# Patient Record
Sex: Male | Born: 1990 | Race: Black or African American | Hispanic: No | Marital: Single | Smoking: Current every day smoker
Health system: Southern US, Community
[De-identification: ages and names within clinical notes are randomized; demographics above are authoritative.]

---
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
## 2009-01-03 ENCOUNTER — Emergency Department (HOSPITAL_COMMUNITY): Admission: EM | Admit: 2009-01-03 | Discharge: 2009-01-04 | Payer: Self-pay | Admitting: Emergency Medicine

## 2009-11-29 ENCOUNTER — Emergency Department (HOSPITAL_COMMUNITY): Admission: EM | Admit: 2009-11-29 | Discharge: 2009-11-29 | Payer: Self-pay | Admitting: Family Medicine

## 2009-12-24 ENCOUNTER — Emergency Department: Payer: Self-pay | Admitting: Unknown Physician Specialty

## 2010-06-26 ENCOUNTER — Emergency Department (HOSPITAL_COMMUNITY): Admission: EM | Admit: 2010-06-26 | Discharge: 2010-06-26 | Payer: Self-pay | Admitting: Emergency Medicine

## 2010-10-07 ENCOUNTER — Emergency Department (HOSPITAL_COMMUNITY): Payer: Self-pay

## 2010-10-07 ENCOUNTER — Emergency Department (HOSPITAL_COMMUNITY)
Admission: EM | Admit: 2010-10-07 | Discharge: 2010-10-07 | Disposition: A | Discharge status: Home / Self Care | Payer: Self-pay | Attending: Emergency Medicine | Admitting: Emergency Medicine

## 2010-10-07 DIAGNOSIS — R05 Cough: Secondary | ICD-10-CM | POA: Insufficient documentation

## 2010-10-07 DIAGNOSIS — IMO0001 Reserved for inherently not codable concepts without codable children: Secondary | ICD-10-CM | POA: Insufficient documentation

## 2010-10-07 DIAGNOSIS — R070 Pain in throat: Secondary | ICD-10-CM | POA: Insufficient documentation

## 2010-10-07 DIAGNOSIS — R059 Cough, unspecified: Secondary | ICD-10-CM | POA: Insufficient documentation

## 2010-10-07 DIAGNOSIS — R071 Chest pain on breathing: Secondary | ICD-10-CM | POA: Insufficient documentation

## 2010-10-07 DIAGNOSIS — J069 Acute upper respiratory infection, unspecified: Secondary | ICD-10-CM | POA: Insufficient documentation

## 2010-10-07 DIAGNOSIS — J3489 Other specified disorders of nose and nasal sinuses: Secondary | ICD-10-CM | POA: Insufficient documentation

## 2010-11-16 LAB — GC/CHLAMYDIA PROBE AMP, GENITAL: Chlamydia, DNA Probe: POSITIVE — AB

## 2010-11-16 LAB — URINALYSIS, ROUTINE W REFLEX MICROSCOPIC
Glucose, UA: NEGATIVE mg/dL
Ketones, ur: 15 mg/dL — AB
Nitrite: NEGATIVE
Protein, ur: 100 mg/dL — AB
Specific Gravity, Urine: 1.028 (ref 1.005–1.030)
Urobilinogen, UA: 1 mg/dL (ref 0.0–1.0)
pH: 6.5 (ref 5.0–8.0)

## 2010-11-16 LAB — URINE MICROSCOPIC-ADD ON

## 2010-11-27 LAB — GC/CHLAMYDIA PROBE AMP, GENITAL
Chlamydia, DNA Probe: NEGATIVE
GC Probe Amp, Genital: POSITIVE — AB

## 2011-07-23 ENCOUNTER — Emergency Department (HOSPITAL_COMMUNITY): Payer: Self-pay

## 2011-07-23 ENCOUNTER — Encounter: Payer: Self-pay | Admitting: Emergency Medicine

## 2011-07-23 ENCOUNTER — Emergency Department (HOSPITAL_COMMUNITY)
Admission: EM | Admit: 2011-07-23 | Discharge: 2011-07-23 | Disposition: A | Discharge status: Home / Self Care | Payer: Self-pay | Attending: Emergency Medicine | Admitting: Emergency Medicine

## 2011-07-23 DIAGNOSIS — R059 Cough, unspecified: Secondary | ICD-10-CM | POA: Insufficient documentation

## 2011-07-23 DIAGNOSIS — R05 Cough: Secondary | ICD-10-CM | POA: Insufficient documentation

## 2011-07-23 DIAGNOSIS — R55 Syncope and collapse: Secondary | ICD-10-CM

## 2011-07-23 DIAGNOSIS — R079 Chest pain, unspecified: Secondary | ICD-10-CM | POA: Insufficient documentation

## 2011-07-23 LAB — POCT I-STAT, CHEM 8
BUN: 6 mg/dL (ref 6–23)
Calcium, Ion: 1.2 mmol/L (ref 1.12–1.32)
Creatinine, Ser: 0.9 mg/dL (ref 0.50–1.35)
TCO2: 27 mmol/L (ref 0–100)

## 2011-07-23 MED ORDER — OXYCODONE-ACETAMINOPHEN 5-325 MG PO TABS
1.0000 | ORAL_TABLET | Freq: Once | ORAL | Status: AC
  Administered 2011-07-23: 1 via ORAL
  Filled 2011-07-23: qty 1

## 2011-07-23 MED ORDER — OXYCODONE-ACETAMINOPHEN 5-325 MG PO TABS: 1.0000 | ORAL_TABLET | Freq: Four times a day (QID) | ORAL | Status: AC | PRN

## 2011-07-23 NOTE — ED Notes (Signed)
Pt tolerated PO challenge

## 2011-07-23 NOTE — ED Notes (Signed)
Gave new ECG to Dr. Bebe Shaggy after I performed. 2:44 pm JG

## 2011-07-23 NOTE — ED Provider Notes (Addendum)
History     CSN: [id number] Arrival date & time: 07/23/2011  2:03 PM   First MD Initiated Contact with Patient 07/23/11 1403   Pt seen with medical student, I performed history and physical    Chief Complaint  Patient presents with  . Chest Pain     Patient is a 20 y.o. male presenting with chest pain. The history is provided by the patient.  Chest Pain Episode onset: several days ago. Chest pain occurs constantly. The chest pain is unchanged. The pain is associated with lifting and breathing (palpation). The severity of the pain is moderate. The quality of the pain is described as aching. The pain does not radiate. Chest pain is worsened by certain positions. Primary symptoms include syncope. Pertinent negatives for primary symptoms include no abdominal pain and no altered mental status.  Pertinent negatives for associated symptoms include no diaphoresis. He tried nothing for the symptoms.   Pt is here for multiple complaints: chest pain for several days, reports recent cough and his chest wall has been tender.  He also reports syncope episode yesterday and today - today's episode occurred after he went running after a phone call.  His mother reports she found him lying down yesterday, unclear cause.   Pt reports he is active, runs frequently, before this past week never had cp/sob/syncope with exertion No recent travel No h/o DVT No h/o familal sudden death Mother reports she has heart disease Pt denies h/o cocaine use No head injury reported No significant headache reported  PMH - none  History reviewed. No pertinent past surgical history.  History reviewed. No pertinent family history.  History  Substance Use Topics  . Smoking status: Current Everyday Smoker -- 2.0 packs/day  . Smokeless tobacco: Not on file  . Alcohol Use: No      Review of Systems  Constitutional: Negative for diaphoresis.  Cardiovascular: Positive for chest pain and syncope.  Gastrointestinal:  Negative for abdominal pain.  Psychiatric/Behavioral: Negative for altered mental status.  All other systems reviewed and are negative.    Allergies  Review of patient's allergies indicates no known allergies.  Home Medications  No current outpatient prescriptions on file.  BP 117/70  Pulse 65  Temp(Src) 98.6 F (37 C) (Oral)  Resp 18  SpO2 98%  Physical Exam CONSTITUTIONAL: Well developed/well nourished HEAD AND FACE: Normocephalic/atraumatic EYES: EOMI/PERRL ENMT: Mucous membranes moist NECK: supple no meningeal signs SPINE:entire spine nontender CV: S1/S2 noted, no murmurs/rubs/gallops noted Chest - tender to palpation, no crepitance LUNGS: Lungs are clear to auscultation bilaterally, no apparent distress ABDOMEN: soft, nontender, no rebound or guarding GU:no cva tenderness NEURO: Pt is awake/alert, moves all extremitiesx4, ambulatory EXTREMITIES: pulses normal, full ROM, no edema noted SKIN: warm, color normal PSYCH: no abnormalities of mood noted  ED Course  Procedures    Labs Reviewed  I-STAT TROPONIN I  I-STAT, CHEM 8    3:21 PM Pt stable at this time [facility] negative Doubt PE EKG unchanged from prior Will get CXR.  Will get troponin to r/o any obvious myocarditis as he reports recent cough Doubt ACS Doubt HOCM   4:23 PM Pt feeling improved Plan is to await labs and reassess   MDM  Nursing notes reviewed and considered in documentation Previous records reviewed and considered xrays reviewed and considered     Date: 07/23/2011  Rate: 59  Rhythm: sinus bradycardia  QRS Axis: normal  Intervals: normal  ST/T Wave abnormalities: early repolarization  Conduction Disutrbances:none  Narrative  Interpretation:   Old EKG Reviewed: unchanged from prior EKG from 10/07/2010         Joya Gaskins, MD 07/23/11 1623  I discharged patient Dr hunt did not see patient He is improved, ambulatory He is to f/u with cardiology I asked him to  avoid activity for next week until seen by cardiology Family/patient agreeable  Joya Gaskins, MD 07/23/11 [phone]

## 2011-07-23 NOTE — ED Notes (Signed)
Pt BIBEMS with c/o chest pain, pt states left side non radiating chest pain for three days s/p playing basketball and working out

## 2011-07-23 NOTE — ED Notes (Signed)
Gave ECG to Dr.

## 2012-03-19 ENCOUNTER — Encounter (HOSPITAL_COMMUNITY): Payer: Self-pay | Admitting: Adult Health

## 2012-03-19 ENCOUNTER — Emergency Department (HOSPITAL_COMMUNITY)
Admission: EM | Admit: 2012-03-19 | Discharge: 2012-03-19 | Payer: Self-pay | Attending: Emergency Medicine | Admitting: Emergency Medicine

## 2012-03-19 ENCOUNTER — Emergency Department (HOSPITAL_COMMUNITY): Payer: Self-pay

## 2012-03-19 DIAGNOSIS — S00432A Contusion of left ear, initial encounter: Secondary | ICD-10-CM

## 2012-03-19 DIAGNOSIS — S01309A Unspecified open wound of unspecified ear, initial encounter: Secondary | ICD-10-CM | POA: Insufficient documentation

## 2012-03-19 DIAGNOSIS — R51 Headache: Secondary | ICD-10-CM | POA: Insufficient documentation

## 2012-03-19 DIAGNOSIS — S00439A Contusion of unspecified ear, initial encounter: Secondary | ICD-10-CM | POA: Insufficient documentation

## 2012-03-19 DIAGNOSIS — R220 Localized swelling, mass and lump, head: Secondary | ICD-10-CM | POA: Insufficient documentation

## 2012-03-19 DIAGNOSIS — S0990XA Unspecified injury of head, initial encounter: Secondary | ICD-10-CM | POA: Insufficient documentation

## 2012-03-19 DIAGNOSIS — S01319A Laceration without foreign body of unspecified ear, initial encounter: Secondary | ICD-10-CM

## 2012-03-19 MED ORDER — HYDROCODONE-ACETAMINOPHEN 5-325 MG PO TABS
1.0000 | ORAL_TABLET | Freq: Once | ORAL | Status: AC
  Administered 2012-03-19: 1 via ORAL
  Filled 2012-03-19: qty 1

## 2012-03-19 MED ORDER — IBUPROFEN 800 MG PO TABS: 800.0000 mg | ORAL_TABLET | Freq: Three times a day (TID) | ORAL | Status: AC

## 2012-03-19 MED ORDER — CEPHALEXIN 500 MG PO CAPS: 500.0000 mg | ORAL_CAPSULE | Freq: Four times a day (QID) | ORAL | Status: AC

## 2012-03-19 MED ORDER — TETANUS-DIPHTH-ACELL PERTUSSIS 5-2.5-18.5 LF-MCG/0.5 IM SUSP
0.5000 mL | Freq: Once | INTRAMUSCULAR | Status: AC
  Administered 2012-03-19: 0.5 mL via INTRAMUSCULAR
  Filled 2012-03-19: qty 0.5

## 2012-03-19 NOTE — ED Provider Notes (Signed)
Medical screening examination/treatment/procedure(s) were conducted as a shared visit with non-physician practitioner(s) and myself.  I personally evaluated the patient during the encounter   Shelda Jakes, MD 03/19/12 [phone]
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
## 2012-03-19 NOTE — ED Notes (Signed)
Discharged home with written and verbal instructions.  Released to [employer].

## 2012-03-19 NOTE — ED Provider Notes (Signed)
Patient seen by Dr Deretha Emory.  Please see his note for full H&P, care plan, etc.    LACERATION REPAIR Performed by: Rise Patience  Laverle Patter, PA-S, supervised by Sahory Nordling B) Consent: Verbal consent obtained. Risks and benefits: risks, benefits and alternatives were discussed Patient identity confirmed: provided demographic data Time out performed prior to procedure Prepped and Draped in normal sterile fashion Wound explored  Laceration Location: left pinna, posterior  Laceration Length: 4cm  No Foreign Bodies seen or palpated  Anesthesia: local infiltration  Local anesthetic: lidocaine 2% no epinephrine  Anesthetic total: 4 ml  Irrigation method: syringe Amount of cleaning: standard  Skin closure: 5-0 nylon  Number of sutures or staples: 5  Technique: simple interrupted  Patient tolerance: Patient tolerated the procedure well with no immediate complications.   Rise Patience, [place] 03/19/12 0630
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
## 2012-03-19 NOTE — ED Notes (Signed)
ENT

## 2012-03-19 NOTE — ED Notes (Signed)
ENT MD paged.
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
## 2013-02-23 ENCOUNTER — Encounter (HOSPITAL_COMMUNITY): Payer: Self-pay | Admitting: Emergency Medicine

## 2013-02-23 ENCOUNTER — Emergency Department (INDEPENDENT_AMBULATORY_CARE_PROVIDER_SITE_OTHER)
Admission: EM | Admit: 2013-02-23 | Discharge: 2013-02-23 | Disposition: A | Discharge status: Home / Self Care | Payer: Self-pay | Source: Home / Self Care | Attending: Family Medicine | Admitting: Family Medicine

## 2013-02-23 DIAGNOSIS — B029 Zoster without complications: Secondary | ICD-10-CM

## 2013-02-23 DIAGNOSIS — J309 Allergic rhinitis, unspecified: Secondary | ICD-10-CM

## 2013-02-23 DIAGNOSIS — B028 Zoster with other complications: Secondary | ICD-10-CM

## 2013-02-23 DIAGNOSIS — J302 Other seasonal allergic rhinitis: Secondary | ICD-10-CM

## 2013-02-23 MED ORDER — VALACYCLOVIR HCL 1 G PO TABS: 1000.0000 mg | ORAL_TABLET | Freq: Three times a day (TID) | ORAL | Status: DC

## 2013-02-23 MED ORDER — FLUTICASONE PROPIONATE 50 MCG/ACT NA SUSP: 1.0000 | Freq: Two times a day (BID) | NASAL | Status: DC

## 2013-02-23 MED ORDER — FEXOFENADINE HCL 180 MG PO TABS: 180.0000 mg | ORAL_TABLET | Freq: Every day | ORAL | Status: DC

## 2013-02-23 NOTE — ED Provider Notes (Signed)
History     CSN: [id number]  Arrival date & time 02/23/13  1513   First MD Initiated Contact with Patient 02/23/13 1553      Chief Complaint  Patient presents with  . Rash    (Consider location/radiation/quality/duration/timing/severity/associated sxs/prior treatment) Patient is a 22 y.o. male presenting with rash. The history is provided by the patient.  Rash Pain quality: burning   Pain radiates to:  Chest Pain severity:  Mild Onset quality:  Sudden Duration:  2 days Progression:  Worsening Chronicity:  New Associated symptoms: chest pain   Associated symptoms: no cough, no fever and no shortness of breath     History reviewed. No pertinent past medical history.  History reviewed. No pertinent past surgical history.  No family history on file.  History  Substance Use Topics  . Smoking status: Current Every Day Smoker -- 2.00 packs/day  . Smokeless tobacco: Not on file  . Alcohol Use: No      Review of Systems  Constitutional: Negative.  Negative for fever.  HENT: Positive for congestion, rhinorrhea and postnasal drip.   Respiratory: Negative for cough and shortness of breath.   Cardiovascular: Positive for chest pain.  Skin: Positive for rash.    Allergies  Review of patient's allergies indicates no known allergies.  Home Medications   Current Outpatient Rx  Name  Route  Sig  Dispense  Refill  . fexofenadine (ALLEGRA) 180 MG tablet   Oral   Take 1 tablet (180 mg total) by mouth daily.   30 tablet   1   . fluticasone (FLONASE) 50 MCG/ACT nasal spray   Nasal   Place 1 spray into the nose 2 (two) times daily.   1 g   2   . valACYclovir (VALTREX) 1000 MG tablet   Oral   Take 1 tablet (1,000 mg total) by mouth 3 (three) times daily.   21 tablet   0     BP 116/77  Pulse 76  Temp(Src) 97.8 F (36.6 C) (Oral)  SpO2 94%  Physical Exam  Nursing note and vitals reviewed. Constitutional: He is oriented to person, place, and time. He appears  well-developed and well-nourished.  HENT:  Head: Normocephalic.  Right Ear: External ear normal.  Left Ear: External ear normal.  Nose: Mucosal edema and rhinorrhea present.  Mouth/Throat: Oropharynx is clear and moist.  Cardiovascular: Regular rhythm.   Pulmonary/Chest: Effort normal and breath sounds normal.  Neurological: He is alert and oriented to person, place, and time.  Skin: Skin is warm and dry. Rash noted.  Erythematous grouped vesicular left axillary rash, with assoc neuralgia of scapula.    ED Course  Procedures (including critical care time)  Labs Reviewed - No data to display No results found.   1. Herpes zoster dermatitis   2. Seasonal allergic rhinitis       MDM          Linna Hoff, MD 02/23/13 1601

## 2013-02-23 NOTE — ED Notes (Signed)
Pt here with localized raised rash to left axilla with radiating burning pain to back x 1 week.no other sx noted.vss

## 2014-12-21 ENCOUNTER — Encounter (HOSPITAL_COMMUNITY): Payer: Self-pay | Admitting: Family Medicine

## 2014-12-21 ENCOUNTER — Other Ambulatory Visit (HOSPITAL_COMMUNITY)
Admission: RE | Admit: 2014-12-21 | Discharge: 2014-12-21 | Disposition: A | Discharge status: Home / Self Care | Payer: Self-pay | Source: Ambulatory Visit | Attending: Family Medicine | Admitting: Family Medicine

## 2014-12-21 ENCOUNTER — Emergency Department (INDEPENDENT_AMBULATORY_CARE_PROVIDER_SITE_OTHER)
Admission: EM | Admit: 2014-12-21 | Discharge: 2014-12-21 | Disposition: A | Discharge status: Home / Self Care | Payer: Self-pay | Source: Home / Self Care | Attending: Family Medicine | Admitting: Family Medicine

## 2014-12-21 DIAGNOSIS — Z113 Encounter for screening for infections with a predominantly sexual mode of transmission: Secondary | ICD-10-CM | POA: Insufficient documentation

## 2014-12-21 DIAGNOSIS — R369 Urethral discharge, unspecified: Secondary | ICD-10-CM

## 2014-12-21 MED ORDER — AZITHROMYCIN 250 MG PO TABS
1000.0000 mg | ORAL_TABLET | Freq: Once | ORAL | Status: AC
Start: 2014-12-21 — End: 2014-12-21
  Administered 2014-12-21: 1000 mg via ORAL

## 2014-12-21 MED ORDER — AZITHROMYCIN 250 MG PO TABS
ORAL_TABLET | ORAL | Status: AC
  Filled 2014-12-21: qty 4

## 2014-12-21 MED ORDER — CEFTRIAXONE SODIUM 250 MG IJ SOLR
INTRAMUSCULAR | Status: AC
  Filled 2014-12-21: qty 250

## 2014-12-21 MED ORDER — CEFTRIAXONE SODIUM 250 MG IJ SOLR
250.0000 mg | Freq: Once | INTRAMUSCULAR | Status: AC
Start: 2014-12-21 — End: 2014-12-21
  Administered 2014-12-21: 250 mg via INTRAMUSCULAR

## 2014-12-21 NOTE — ED Notes (Signed)
Sprite zero with ice given to pt per request and RN ok.
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
## 2014-12-21 NOTE — ED Notes (Signed)
Pt  Reports  Symptoms  Of  Penile  Discharge

## 2014-12-21 NOTE — ED Provider Notes (Signed)
CSN: [id number]     Arrival date & time 12/21/14  1004 History   First MD Initiated Contact with Patient 12/21/14 1117     Chief Complaint  Patient presents with  . SEXUALLY TRANSMITTED DISEASE   (Consider location/radiation/quality/duration/timing/severity/associated sxs/prior Treatment) HPI  Upper abd pain: started 2 days ago. Went to [employer] and used a Neurosurgeonfilthy bathroom. Went home and developed white penile discharge and abd pain. Comes and goes. Worse w/ urination.   Dysuria: sharp burning sensation w/ urination.   Sexually active and uses condoms every time. Last sex 1 week ago. After lengthy discussion patient finally admitted to the fact that he had unprotected sex 2 weeks ago. No known sexually transmitted disease and sexual partners.  History reviewed. No pertinent past medical history. History reviewed. No pertinent past surgical history. Family History  Problem Relation Age of Onset  . Diabetes Mother   . Hypertension Mother   . Asthma Mother    History  Substance Use Topics  . Smoking status: Former Smoker -- 2.00 packs/day  . Smokeless tobacco: Not on file  . Alcohol Use: Yes     Comment: 2 beers daily    Review of Systems Per HPI with all other pertinent systems negative.   Allergies  Review of patient's allergies indicates no known allergies.  Home Medications   Prior to Admission medications   Not on File   BP 146/94 mmHg  Pulse 80  Temp(Src) 98.7 F (37.1 C) (Oral)  Resp 16  SpO2 99% Physical Exam Physical Exam  Constitutional: oriented to person, place, and time. appears well-developed and well-nourished. No distress.  HENT:  Head: Normocephalic and atraumatic.  Eyes: EOMI. PERRL.  Neck: Normal range of motion.  Cardiovascular: RRR, no m/r/g, 2+ distal pulses,  Pulmonary/Chest: Effort normal and breath sounds normal. No respiratory distress.  Abdominal: Soft. Bowel sounds are normal. NonTTP, no distension.  Musculoskeletal: Normal range of  motion. Non ttp, no effusion.  Neurological: alert and oriented to person, place, and time.  Skin: Skin is warm. No rash noted. non diaphoretic.  Psychiatric: normal mood and affect. behavior is normal. Judgment and thought content normal.  GU: Copious penile discharge on exam. No penile lesions. Testicles normal. No groin adenopathy.  ED Course  Procedures (including critical care time) Labs Review Labs Reviewed  URINE CULTURE  HIV ANTIBODY (ROUTINE TESTING)  RPR  URINE CYTOLOGY ANCILLARY ONLY    Imaging Review No results found.   MDM   1. Penile discharge    Azithromycin 1000 mg by mouth and ceftriaxone 250 mg IM administered likely gonorrhea or chlamydia. Discussed safe sex practices with patient. UA without evidence of UTI. urine culture sent.  HIV and RPR pending.    Ozella Rocksavid J Lindsee Labarre, MD 12/21/14 [phone]

## 2014-12-22 LAB — HIV ANTIBODY (ROUTINE TESTING W REFLEX): HIV SCREEN 4TH GENERATION: NONREACTIVE

## 2014-12-22 LAB — URINE CYTOLOGY ANCILLARY ONLY
Chlamydia: NEGATIVE
Neisseria Gonorrhea: POSITIVE — AB
TRICH (WINDOWPATH): NEGATIVE

## 2014-12-22 LAB — URINE CULTURE

## 2014-12-22 LAB — RPR: RPR Ser Ql: NONREACTIVE

## 2014-12-28 ENCOUNTER — Telehealth (HOSPITAL_COMMUNITY): Payer: Self-pay | Admitting: *Deleted

## 2014-12-28 NOTE — ED Notes (Signed)
GC pos., Chlamydia and Trich neg., HIV/RPR non-reactive, Urine culture: Multiple bacterial types none predominant.  Pt. adequately treated with Rocephin and Zithromax.  I called pt. And left a message to call. Call 1.  DHHS [facility]. Vassie MoselleYork, Rai Sinagra M 12/28/2014

## 2015-04-26 ENCOUNTER — Encounter (HOSPITAL_COMMUNITY): Payer: Self-pay | Admitting: *Deleted

## 2015-04-26 ENCOUNTER — Emergency Department (HOSPITAL_COMMUNITY)
Admission: EM | Admit: 2015-04-26 | Discharge: 2015-04-26 | Disposition: A | Discharge status: Home / Self Care | Payer: Self-pay | Attending: Emergency Medicine | Admitting: Emergency Medicine

## 2015-04-26 ENCOUNTER — Emergency Department (HOSPITAL_COMMUNITY): Payer: Self-pay

## 2015-04-26 DIAGNOSIS — IMO0001 Reserved for inherently not codable concepts without codable children: Secondary | ICD-10-CM

## 2015-04-26 DIAGNOSIS — Z720 Tobacco use: Secondary | ICD-10-CM | POA: Insufficient documentation

## 2015-04-26 DIAGNOSIS — S63051A Subluxation of other carpometacarpal joint of right hand, initial encounter: Secondary | ICD-10-CM

## 2015-04-26 DIAGNOSIS — S59911A Unspecified injury of right forearm, initial encounter: Secondary | ICD-10-CM | POA: Insufficient documentation

## 2015-04-26 DIAGNOSIS — S62141A Displaced fracture of body of hamate [unciform] bone, right wrist, initial encounter for closed fracture: Secondary | ICD-10-CM | POA: Insufficient documentation

## 2015-04-26 DIAGNOSIS — Y9289 Other specified places as the place of occurrence of the external cause: Secondary | ICD-10-CM | POA: Insufficient documentation

## 2015-04-26 DIAGNOSIS — S63054A Dislocation of other carpometacarpal joint of right hand, initial encounter: Secondary | ICD-10-CM | POA: Insufficient documentation

## 2015-04-26 DIAGNOSIS — W2201XA Walked into wall, initial encounter: Secondary | ICD-10-CM | POA: Insufficient documentation

## 2015-04-26 DIAGNOSIS — Y9389 Activity, other specified: Secondary | ICD-10-CM | POA: Insufficient documentation

## 2015-04-26 DIAGNOSIS — Y998 Other external cause status: Secondary | ICD-10-CM | POA: Insufficient documentation

## 2015-04-26 MED ORDER — OXYCODONE-ACETAMINOPHEN 5-325 MG PO TABS
2.0000 | ORAL_TABLET | Freq: Once | ORAL | Status: AC
Start: 2015-04-26 — End: 2015-04-26
  Administered 2015-04-26: 2 via ORAL
  Filled 2015-04-26: qty 2

## 2015-04-26 MED ORDER — OXYCODONE-ACETAMINOPHEN 5-325 MG PO TABS: 1.0000 | ORAL_TABLET | Freq: Four times a day (QID) | ORAL | Status: DC | PRN

## 2015-04-26 NOTE — ED Notes (Signed)
Introduced self to patient. Pt ignores this [employer] fund[employer]. Pt has tears streaming down face. Asked patient if he was going to talk to me he then says "yeah". Strong radial pulse present. Ice pack provided.

## 2015-04-26 NOTE — ED Notes (Signed)
MD at bedside. 

## 2015-04-26 NOTE — Discharge Instructions (Signed)
Keep arm elevated when possible.  Leave splint in place until seen by Dr Merlyn Lot.  Contact his office this morning to arrange follow up and management of your fracture/dislocation of your right hand/wrist.  Take medication as prescribed for pain.   Cast or Splint Care Casts and splints support injured limbs and keep bones from moving while they heal. It is important to care for your cast or splint at home.  HOME CARE INSTRUCTIONS  Keep the cast or splint uncovered during the drying period. It can take 24 to 48 hours to dry if it is made of plaster. A fiberglass cast will dry in less than 1 hour.  Do not rest the cast on anything harder than a pillow for the first 24 hours.  Do not put weight on your injured limb or apply pressure to the cast until your health care provider gives you permission.  Keep the cast or splint dry. Wet casts or splints can lose their shape and may not support the limb as well. A wet cast that has lost its shape can also create harmful pressure on your skin when it dries. Also, wet skin can become infected.  Cover the cast or splint with a plastic bag when bathing or when out in the rain or snow. If the cast is on the trunk of the body, take sponge baths until the cast is removed.  If your cast does become wet, dry it with a towel or a blow dryer on the cool setting only.  Keep your cast or splint clean. Soiled casts may be wiped with a moistened cloth.  Do not place any hard or soft foreign objects under your cast or splint, such as cotton, toilet paper, lotion, or powder.  Do not try to scratch the skin under the cast with any object. The object could get stuck inside the cast. Also, scratching could lead to an infection. If itching is a problem, use a blow dryer on a cool setting to relieve discomfort.  Do not trim or cut your cast or remove padding from inside of it.  Exercise all joints next to the injury that are not immobilized by the cast or splint. For  example, if you have a long leg cast, exercise the hip joint and toes. If you have an arm cast or splint, exercise the shoulder, elbow, thumb, and fingers.  Elevate your injured arm or leg on 1 or 2 pillows for the first 1 to 3 days to decrease swelling and pain.It is best if you can comfortably elevate your cast so it is higher than your heart. SEEK MEDICAL CARE IF:   Your cast or splint cracks.  Your cast or splint is too tight or too loose.  You have unbearable itching inside the cast.  Your cast becomes wet or develops a soft spot or area.  You have a bad smell coming from inside your cast.  You get an object stuck under your cast.  Your skin around the cast becomes red or raw.  You have new pain or worsening pain after the cast has been applied. SEEK IMMEDIATE MEDICAL CARE IF:   You have fluid leaking through the cast.  You are unable to move your fingers or toes.  You have discolored (blue or white), cool, painful, or very swollen fingers or toes beyond the cast.  You have tingling or numbness around the injured area.  You have severe pain or pressure under the cast.  You have any difficulty  with your breathing or have shortness of breath.  You have chest pain. Document Released: 08/18/2000 Document Revised: 06/11/2013 Document Reviewed: 02/27/2013 [facility] Patient Information 2015 [place], [place]. This information is not intended to replace advice given to you by your health care provider. Make sure you discuss any questions you have with your health care provider.  Hamate (Hook) Fracture The hamate is a bone in the hand that has the "hamate hook," a bony bump (prominence), that is vulnerable to injury. A hamate fracture is a complete or incomplete break of this bone. SYMPTOMS   Pain or soreness on the little finger side (ulnar) of the wrist, on the palm side, or sometimes on the back side.  Pain, tenderness, swelling, and occasionally bruising around the fracture  site, at the base of the wrist.  Pain with gripping or swinging a golf club, bat, or racquet.  Numbness and coldness in the hand.  Swelling in the hand, causing pressure on the blood vessels or nerves. CAUSES  A hamate fracture is usually the result of direct hit (trauma) to the base of the palm from a grasped object, such as the end of a golf club, baseball bat, or racquet. RISK INCREASES WITH:  Sports in which a bat (baseball, cricket), club (golf), or racquet (tennis, racquetball) are used.  History of bone or joint disease, including osteoporosis, or previous hand restraint. PREVENTION  Maintain physical fitness:  Hand and forearm strength.  Flexibility and endurance.  When playing risky sports, wear appropriately fitted and padded protective equipment, such as padded gloves, or use clubs, racquets, and bats with padded grips.  Learn and use proper technique when hitting or swinging a bat, racquet, or club.  If you had a previous injury, use tape or padding to protect your hand before playing contact or jumping sports. PROGNOSIS  Hamate fractures may heal with restraint. However, the function of blood vessels in the area causes hamate injuries to have a decreased ability to heal. Surgery may be performed to remove the broken piece, with a return to sports after 6 to 10 weeks.  RELATED COMPLICATIONS   Fracture does not heal (nonunion), common.  Fracture heals in a poor position (malunion).  Impaired blood supply to the fracture and bones.  Chronic pain, stiffness, or swelling of the hand and wrist, especially with prolonged casting.  Nerve injury to the hand, causing pain, numbness, and weakness or clumsiness in the hand and fingers.  Excessive bleeding in the hand, causing pressure and injury to nerves and blood vessels (rare).  Tearing (rupture) of the tendons that bend the wrist or fingers.  Risks of surgery: infection, bleeding, injury to nerves (numbness,  weakness), nonunion, malunion, arthritis, stiffness, and pain. TREATMENT For bones that are in proper alignment (non-displacedfracture), treatment first involves ice, medicine, and elevation to reduce pain and inflammation. Restraint of the hand and wrist is advised for 6 to 8 weeks, to allow the bones to heal. If the fractured bones are out of alignment (displaced), surgery is advised to either remove the broken piece or fix it with screws and pins. After surgery, the injury is restrained. After restraint (with or without surgery), stretching and strengthening exercises for the injured and weakened joint and muscles are needed. Exercises may be completed at home or with a therapist. Depending on the sport and the position played, a brace or splint may be advised at first, when returning to sports.  MEDICATION   If pain medicine is needed, nonsteroidal anti-inflammatory medicines (aspirin  and ibuprofen), or other minor pain relievers (acetaminophen), are often advised.  Do not take pain medicine for 7 days before surgery.  Prescription pain relievers are usually prescribed only after surgery. Use only as directed and only as much as you need. COLD THERAPY   Cold treatment (icing) relieves pain and reduces inflammation. Cold treatment should be applied for 10 to 15 minutes every 2 to 3 hours, and immediately after activity that aggravates your symptoms. Use ice packs or an ice massage. SEEK MEDICAL CARE IF:   Pain, tenderness, or swelling gets worse, despite treatment.  You experience pain, numbness, or coldness in the hand.  Blue, gray, or dark color appears in the fingernails.  Any of the following occur after surgery: fever, increased pain, swelling, redness, drainage of fluids, or bleeding in the affected area.  New, unexplained symptoms develop. (Drugs used in treatment may produce side effects.) Document Released: 08/21/2005 Document Revised: 11/13/2011 Document Reviewed:  12/03/2008 [facility] Patient Information 2015 [place], [place]. This information is not intended to replace advice given to you by your health care provider. Make sure you discuss any questions you have with your health care provider.   Dislocation or Subluxation Dislocation of a joint occurs when ends of two or more adjacent bones no longer touch each other. A subluxation is a minor form of a dislocation, in which two or more adjacent bones are no longer properly aligned. The most common joints susceptible to a dislocation are the shoulder, kneecap, and fingers.  SYMPTOMS   Sudden pain at the time of injury.  Noticeable deformity in the area of the joint.  Limited range of motion. CAUSES   Usually a traumatic injury that stretches or tears ligaments that surround a joint and hold the bones together.  Condition present at birth (congenital) in which the joint surfaces are shallow or abnormally formed.  Joint disease such as arthritis or other diseases of ligaments and tissues around a joint. RISK INCREASES WITH:  Repeated injury to a joint.  Previous dislocation of a joint.  Contact sports (football, rugby, hockey, lacrosse) or sports that require repetitive overhead arm motion (throwing, swimming, volleyball).  Rheumatoid arthritis.  Congenital joint condition. PREVENTION  Warm up and stretch properly before activity.  Maintain physical fitness:  Joint flexibility.  Muscle strength and endurance.  Cardiovascular fitness.  Wear proper protective equipment and ensure correct fit.  Learn and use proper technique. PROGNOSIS  This condition is usually curable with treatment. After the dislocation has been put back in place, the joint may require immobilization with a cast, splint, or sling for 2 to 6 weeks, often followed by strength and stretching exercises that may be performed at home or with a therapist. RELATED COMPLICATIONS   Damage to nearby nerves or major blood  vessels, causing numbness, coldness, or paleness.  Recurrent injury to the joint.  Arthritis of affected joint.  Fracture of joint. TREATMENT Treatment initially involves realigning the bones (reduction) of the joint. Reductions should only be performed by someone who is trained in the procedure. After the joint is reduced, medicine and ice should be used to reduce pain and inflammation. The joint may be immobilized to allow for the muscles and ligaments to heal. If a joint is subjected to recurrent dislocations, surgery may be necessary to tighten or replace injured structures. After surgery, stretching and strengthening exercises may be required. These may be performed at home or with a therapist. MEDICATION   Patients may require medicine to help them relax (  sedative) or muscle relaxants in order to reduce the joint.  If pain medicine is necessary, nonsteroidal anti-inflammatory medicines, such as aspirin and ibuprofen, or other minor pain relievers, such as acetaminophen, are often recommended.  Do not take pain medicine for 7 days before surgery.  Prescription pain relievers may be necessary. Use only as directed and only as much as you need. SEEK MEDICAL CARE IF:   Symptoms get worse or do not improve despite treatment.  You have difficulty moving a joint after injury.  Any extremity becomes numb, pale, or cool after injury. This is an emergency.  Dislocations or subluxations occur repeatedly. Document Released: 08/21/2005 Document Revised: 11/13/2011 Document Reviewed: 12/03/2008 [facility] Patient Information 2015 [place], [place]. This information is not intended to replace advice given to you by your health care provider. Make sure you discuss any questions you have with your health care provider.
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
## 2015-04-26 NOTE — ED Notes (Signed)
Pt alert,oriented,and ambulatory upon DC. Pt was advised to follow up with Dr. Merlyn Lot. Splint care reviewed with patient.

## 2015-12-03 IMAGING — CR DG HAND COMPLETE 3+V*R*
3 series · 3 of 3 positions shown · non-contrast
Comparison: None.

CLINICAL DATA: Right hand injury tonight. Patient struck a wall.
Pain and swelling.

EXAM:
RIGHT HAND - COMPLETE 3+ VIEW

[x hand pa right]
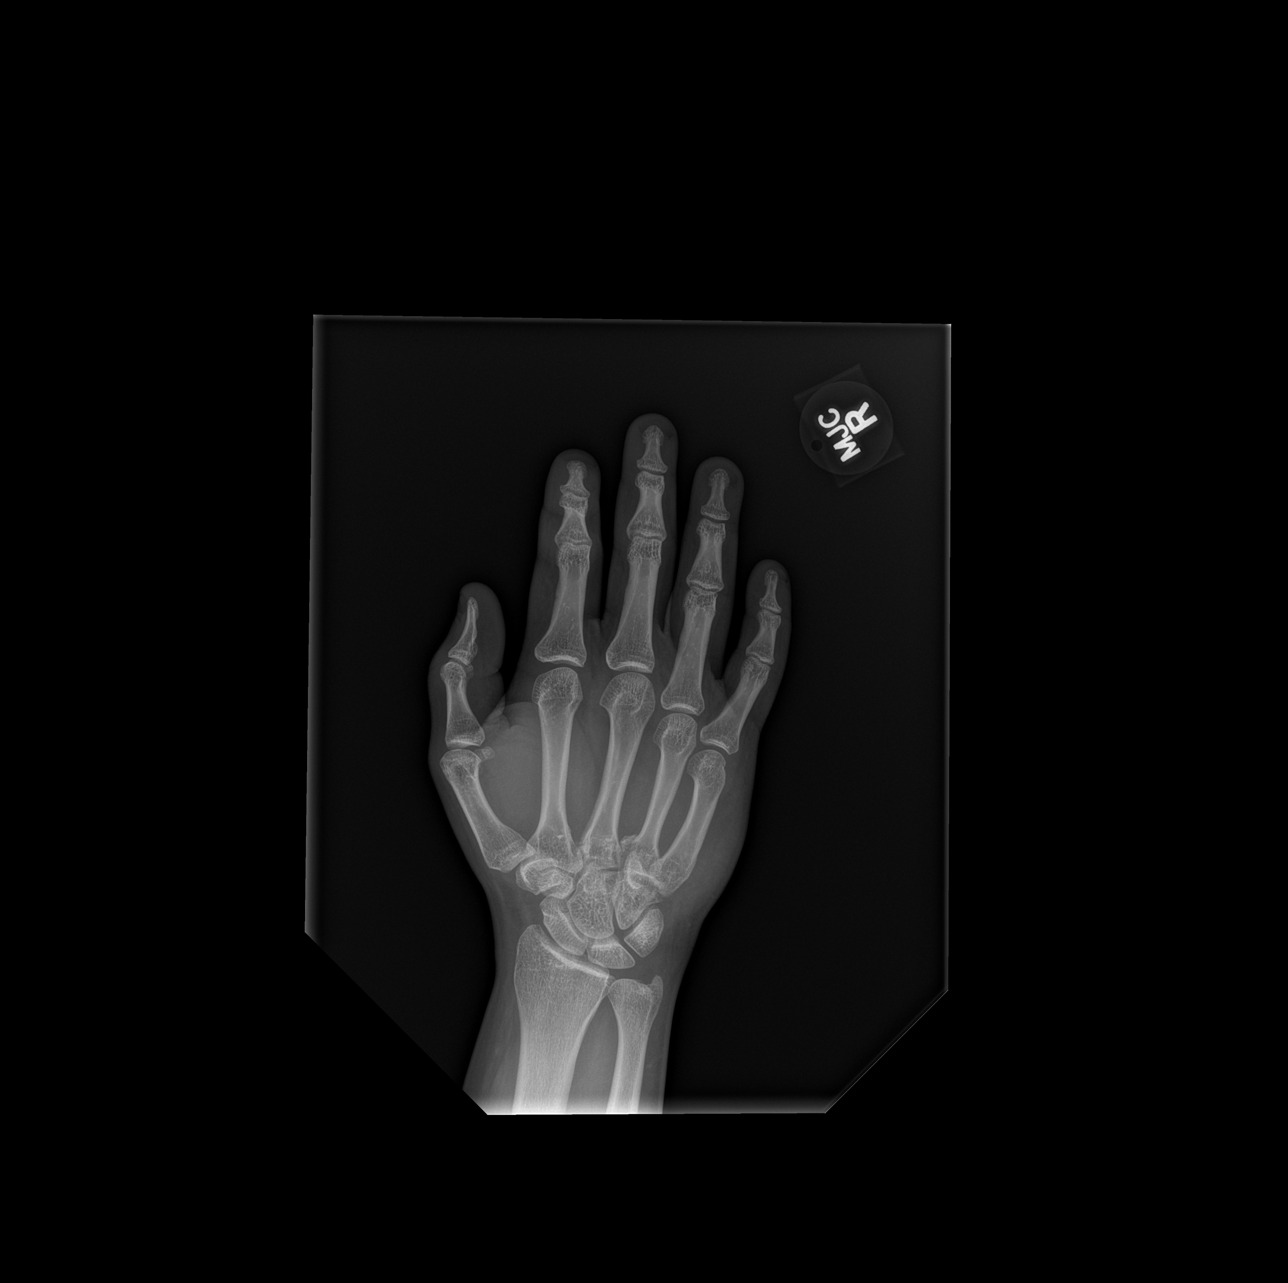

[x hand obl right]
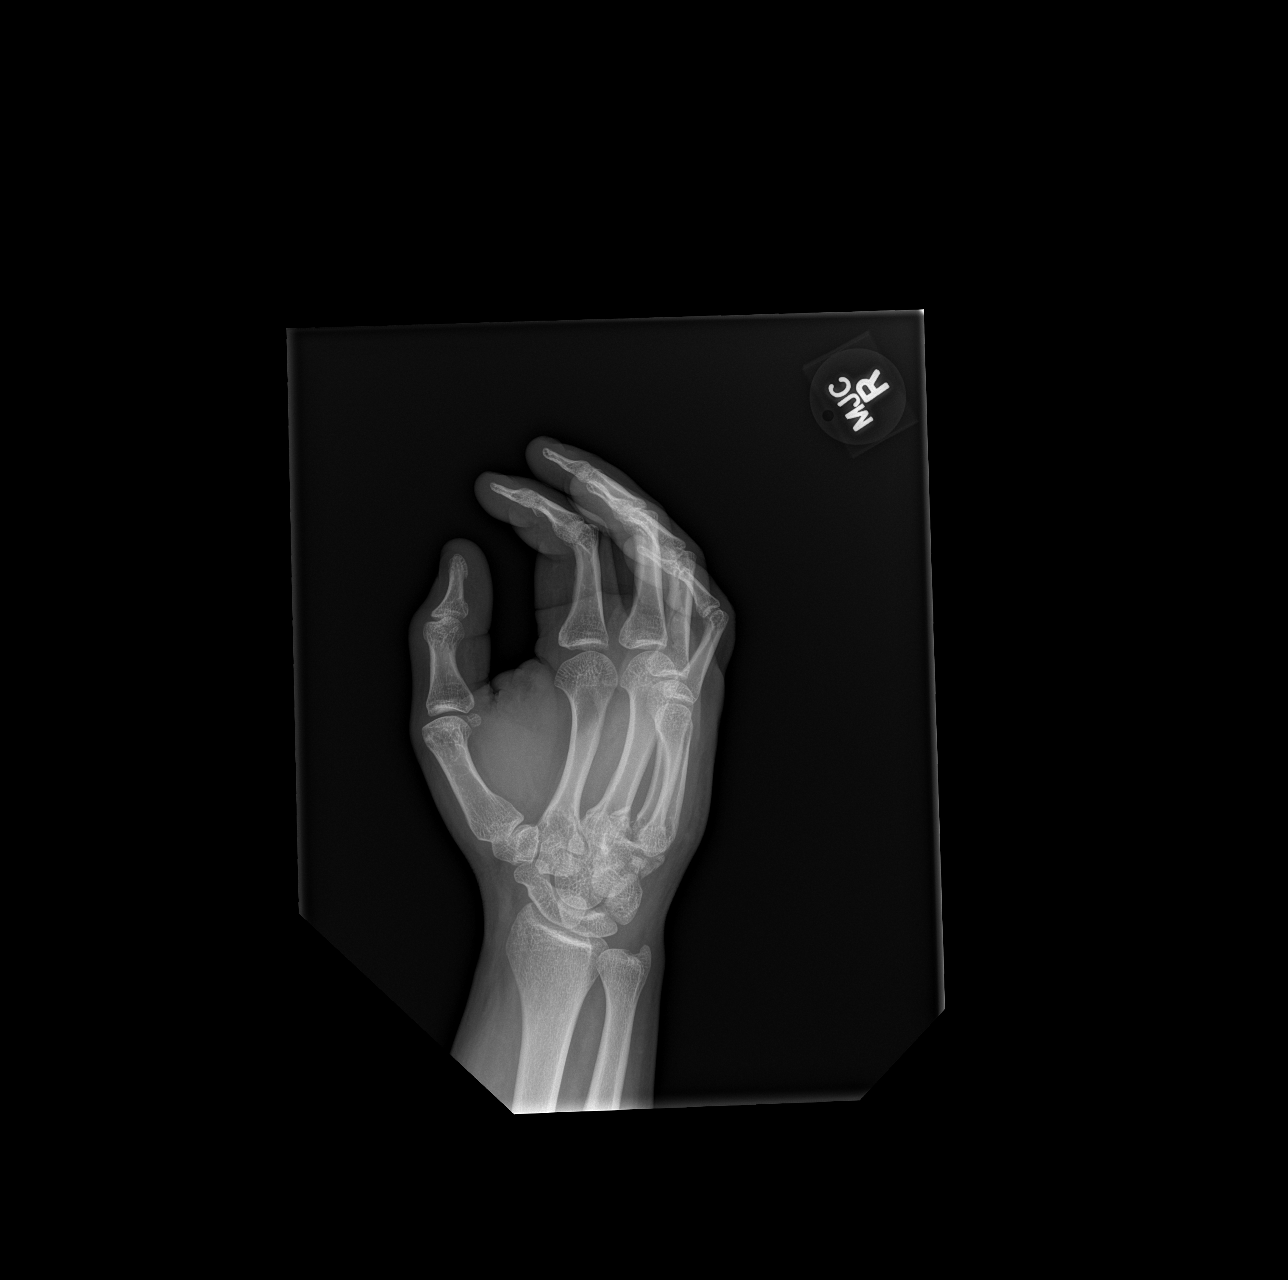

[x hand lat right]
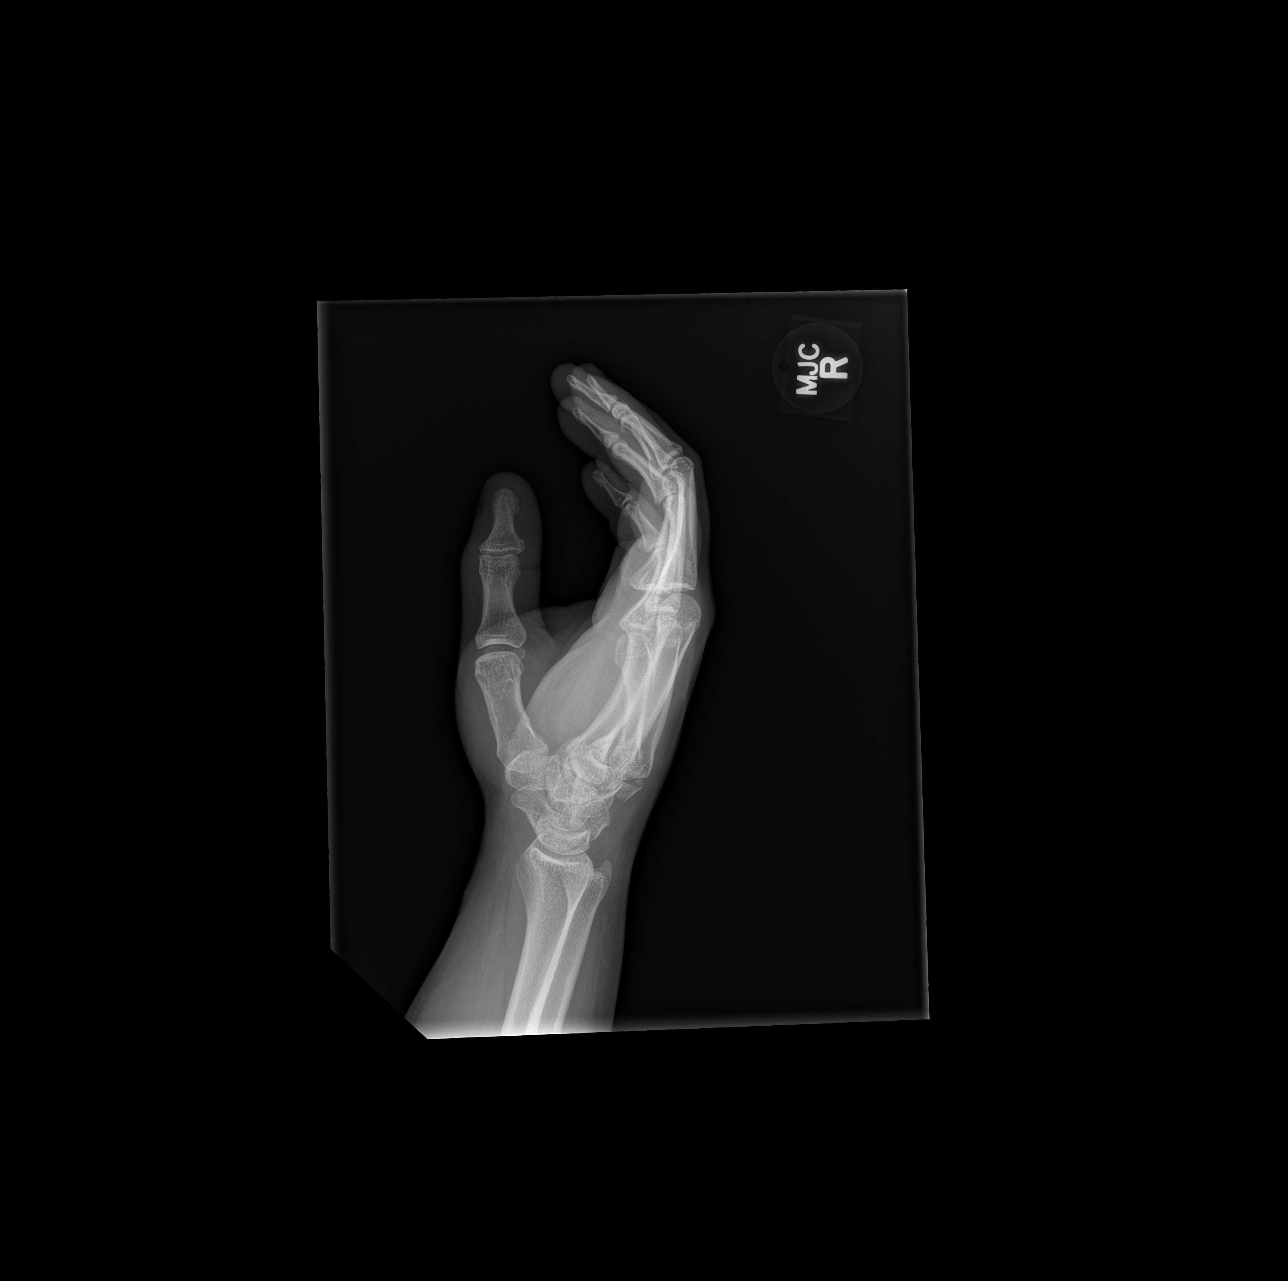

[3 of 3 positions shown; findings below may reference images not displayed]

FINDINGS: Dorsal displacement of the fourth and fifth metacarpal bones with
respect to the carpus with associated fracture of the hamate bone.
Possible fracture of the base of the fourth metacarpal bone is well.
Overlying soft tissue swelling is present.
IMPRESSION: Dorsal dislocation of the fourth and fifth metacarpal bones with
respect to the carpus. Displaced fractures of the dorsal aspect of
the hamate bone and probably of the volar aspect of the base of the
fourth metacarpal bone.

## 2018-07-22 ENCOUNTER — Encounter (HOSPITAL_COMMUNITY): Payer: Self-pay | Admitting: Emergency Medicine

## 2018-07-22 ENCOUNTER — Emergency Department (HOSPITAL_COMMUNITY)
Admission: EM | Admit: 2018-07-22 | Discharge: 2018-07-22 | Disposition: A | Discharge status: Home / Self Care | Payer: Self-pay | Attending: Emergency Medicine | Admitting: Emergency Medicine

## 2018-07-22 DIAGNOSIS — Z23 Encounter for immunization: Secondary | ICD-10-CM | POA: Insufficient documentation

## 2018-07-22 DIAGNOSIS — Y939 Activity, unspecified: Secondary | ICD-10-CM | POA: Insufficient documentation

## 2018-07-22 DIAGNOSIS — Y999 Unspecified external cause status: Secondary | ICD-10-CM | POA: Insufficient documentation

## 2018-07-22 DIAGNOSIS — S31119A Laceration without foreign body of abdominal wall, unspecified quadrant without penetration into peritoneal cavity, initial encounter: Secondary | ICD-10-CM | POA: Insufficient documentation

## 2018-07-22 DIAGNOSIS — F17200 Nicotine dependence, unspecified, uncomplicated: Secondary | ICD-10-CM | POA: Insufficient documentation

## 2018-07-22 DIAGNOSIS — T148XXA Other injury of unspecified body region, initial encounter: Secondary | ICD-10-CM

## 2018-07-22 DIAGNOSIS — Y929 Unspecified place or not applicable: Secondary | ICD-10-CM | POA: Insufficient documentation

## 2018-07-22 MED ORDER — IBUPROFEN 800 MG PO TABS
800.0000 mg | ORAL_TABLET | Freq: Once | ORAL | Status: AC
  Administered 2018-07-22: 800 mg via ORAL
  Filled 2018-07-22: qty 1

## 2018-07-22 MED ORDER — TETANUS-DIPHTH-ACELL PERTUSSIS 5-2.5-18.5 LF-MCG/0.5 IM SUSP
0.5000 mL | Freq: Once | INTRAMUSCULAR | Status: AC
Start: 2018-07-22 — End: 2018-07-22
  Administered 2018-07-22: 0.5 mL via INTRAMUSCULAR
  Filled 2018-07-22: qty 0.5

## 2018-07-22 MED ORDER — ETODOLAC 300 MG PO CAPS: 300.0000 mg | ORAL_CAPSULE | Freq: Three times a day (TID) | ORAL | 0 refills | Status: DC

## 2018-07-22 MED ORDER — LIDOCAINE HCL 2 % IJ SOLN
INTRAMUSCULAR | Status: AC
  Administered 2018-07-22: 400 mg
  Filled 2018-07-22: qty 20

## 2018-07-22 MED ORDER — BACITRACIN ZINC 500 UNIT/GM EX OINT
TOPICAL_OINTMENT | CUTANEOUS | Status: AC
  Administered 2018-07-22: 1
  Filled 2018-07-22: qty 7.2

## 2018-07-22 MED ORDER — LIDOCAINE-EPINEPHRINE (PF) 2 %-1:200000 IJ SOLN
20.0000 mL | Freq: Once | INTRAMUSCULAR | Status: AC
  Administered 2018-07-22: 20 mL
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
## 2018-07-22 NOTE — Discharge Instructions (Signed)
Staple removal in 10-14 days, monitor for signs of infection

## 2018-07-22 NOTE — ED Notes (Signed)
Pt requested for IV to be taken out due to it burning. Pt's IV removed by Clinical research associatewriter. Annice PihJackie, RN ok'ed.
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
## 2018-07-22 NOTE — ED Notes (Signed)
Sutures in place, well approximated.  Bacitracin and dressing applied by EN RN Celeste as ordered.

## 2018-08-01 ENCOUNTER — Emergency Department (HOSPITAL_COMMUNITY)
Admission: EM | Admit: 2018-08-01 | Discharge: 2018-08-01 | Disposition: A | Discharge status: Home / Self Care | Payer: Self-pay | Attending: Emergency Medicine | Admitting: Emergency Medicine

## 2018-08-01 ENCOUNTER — Encounter (HOSPITAL_COMMUNITY): Payer: Self-pay | Admitting: Emergency Medicine

## 2018-08-01 DIAGNOSIS — Z79899 Other long term (current) drug therapy: Secondary | ICD-10-CM | POA: Insufficient documentation

## 2018-08-01 DIAGNOSIS — S21131D Puncture wound without foreign body of right front wall of thorax without penetration into thoracic cavity, subsequent encounter: Secondary | ICD-10-CM | POA: Insufficient documentation

## 2018-08-01 DIAGNOSIS — Z4802 Encounter for removal of sutures: Secondary | ICD-10-CM | POA: Insufficient documentation

## 2018-08-01 DIAGNOSIS — F17200 Nicotine dependence, unspecified, uncomplicated: Secondary | ICD-10-CM | POA: Insufficient documentation
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
## 2018-08-01 NOTE — Discharge Instructions (Signed)
Continue to wash daily with soap and water. Steri-Strips will fall off on their own in several days.  You may get them wet with soap and water, but if you scrub vigorously they will fall off. Continue taking Tylenol and ibuprofen as needed for pain. Return to the emergency room if you develop high fevers, pus draining from the area, severe worsening pain, or any new or concerning symptoms.

## 2021-07-26 ENCOUNTER — Emergency Department (HOSPITAL_BASED_OUTPATIENT_CLINIC_OR_DEPARTMENT_OTHER): Payer: Self-pay

## 2021-07-26 ENCOUNTER — Emergency Department (HOSPITAL_BASED_OUTPATIENT_CLINIC_OR_DEPARTMENT_OTHER)
Admission: EM | Admit: 2021-07-26 | Discharge: 2021-07-26 | Disposition: A | Discharge status: Home / Self Care | Payer: Self-pay | Attending: Emergency Medicine | Admitting: Emergency Medicine

## 2021-07-26 ENCOUNTER — Other Ambulatory Visit: Payer: Self-pay

## 2021-07-26 ENCOUNTER — Encounter (HOSPITAL_BASED_OUTPATIENT_CLINIC_OR_DEPARTMENT_OTHER): Payer: Self-pay

## 2021-07-26 DIAGNOSIS — Z28310 Unvaccinated for covid-19: Secondary | ICD-10-CM | POA: Insufficient documentation

## 2021-07-26 DIAGNOSIS — F17210 Nicotine dependence, cigarettes, uncomplicated: Secondary | ICD-10-CM | POA: Insufficient documentation

## 2021-07-26 DIAGNOSIS — J101 Influenza due to other identified influenza virus with other respiratory manifestations: Secondary | ICD-10-CM | POA: Insufficient documentation

## 2021-07-26 DIAGNOSIS — Z20822 Contact with and (suspected) exposure to covid-19: Secondary | ICD-10-CM | POA: Insufficient documentation

## 2021-07-26 DIAGNOSIS — R000 Tachycardia, unspecified: Secondary | ICD-10-CM | POA: Insufficient documentation

## 2021-07-26 LAB — RESP PANEL BY RT-PCR (FLU A&B, COVID) ARPGX2
Influenza A by PCR: POSITIVE — AB
Influenza B by PCR: NEGATIVE
SARS Coronavirus 2 by RT PCR: NEGATIVE

## 2021-07-26 LAB — URINALYSIS, ROUTINE W REFLEX MICROSCOPIC
Bilirubin Urine: NEGATIVE
Glucose, UA: NEGATIVE mg/dL
Hgb urine dipstick: NEGATIVE
Ketones, ur: NEGATIVE mg/dL
Leukocytes,Ua: NEGATIVE
Nitrite: NEGATIVE
Protein, ur: 30 mg/dL — AB
Specific Gravity, Urine: 1.028 (ref 1.005–1.030)
pH: 6.5 (ref 5.0–8.0)

## 2021-07-26 LAB — GROUP A STREP BY PCR: Group A Strep by PCR: NOT DETECTED

## 2021-07-26 MED ORDER — ACETAMINOPHEN 325 MG PO TABS
650.0000 mg | ORAL_TABLET | Freq: Once | ORAL | Status: DC
Start: 2021-07-26 — End: 2021-07-26

## 2021-07-26 MED ORDER — BENZONATATE 100 MG PO CAPS: 100.0000 mg | ORAL_CAPSULE | Freq: Three times a day (TID) | ORAL | 0 refills | Status: AC

## 2021-07-26 MED ORDER — ACETAMINOPHEN 500 MG PO TABS
1000.0000 mg | ORAL_TABLET | Freq: Once | ORAL | Status: AC
  Administered 2021-07-26: 1000 mg via ORAL
  Filled 2021-07-26: qty 2

## 2021-07-26 MED ORDER — IBUPROFEN 800 MG PO TABS
800.0000 mg | ORAL_TABLET | Freq: Once | ORAL | Status: AC
  Administered 2021-07-26: 800 mg via ORAL
  Filled 2021-07-26: qty 1

## 2021-07-26 NOTE — ED Triage Notes (Signed)
Pt presents for 1 week of body aches, fatigue, hot flashes and pelvic pain. Pt reports frequent urination. Denies burning with urination or penile dc.  Pt is diaphoretic in triage

## 2021-07-26 NOTE — ED Provider Notes (Signed)
[facility] DEPT Provider Note   CSN: [id number] Arrival date & time: 07/26/21  1603     History Chief Complaint  Patient presents with   Generalized Body Aches    NYSIR KRISHNAMOORTHY is a 30 y.o. male.  HPI  Patient presents with generalized body aches, cough, nasal congestion, sore throat x1 week.  He has tried TheraFlu, cough drops, leftover antibiotics from a dental pain visit without any relief of symptoms.  The symptoms are constant, no obvious provoking features.  He has not had the COVID-vaccine or flu vaccine, denies any sick contacts that he is aware of.  No chest pain or shortness of breath.  Also complaining of dysuria, denies any specific abdominal pain.  No hematuria, no penile discharge.  History reviewed. No pertinent past medical history.  There are no problems to display for this patient.   No past surgical history on file.     Family History  Problem Relation Age of Onset   Diabetes Mother    Hypertension Mother    Asthma Mother     Social History   Tobacco Use   Smoking status: Every Day    Packs/day: 0.20    Types: Cigarettes   Smokeless tobacco: Never  Vaping Use   Vaping Use: Never used  Substance Use Topics   Alcohol use: Yes    Comment: 2 beers daily   Drug use: No    Home Medications Prior to Admission medications   Medication Sig Start Date End Date Taking? Authorizing Provider  etodolac (LODINE) 300 MG capsule Take 1 capsule (300 mg total) by mouth every 8 (eight) hours. Patient not taking: Reported on 07/26/2021 07/22/18   Dorie Rank, MD  oxyCODONE-acetaminophen (PERCOCET/ROXICET) 5-325 MG per tablet Take 1-2 tablets by mouth every 6 (six) hours as needed for severe pain. Patient not taking: Reported on 07/26/2021 04/26/15   Linton Flemings, MD    Allergies    Patient has no known allergies.  Review of Systems   Review of Systems  Constitutional:  Positive for fever.  HENT:  Positive for congestion and  sore throat.   Respiratory:  Negative for shortness of breath.   Cardiovascular:  Negative for chest pain.  Gastrointestinal:  Negative for abdominal pain.  Genitourinary:  Positive for dysuria. Negative for hematuria.  Musculoskeletal:  Positive for myalgias.  Neurological:  Positive for headaches.   Physical Exam Updated Vital Signs BP 131/84 (BP Location: Right Arm)   Pulse (!) 117   Temp (!) 101.2 F (38.4 C) (Oral)   Resp 16   Wt 104.3 kg   SpO2 100%   BMI 33.97 kg/m   Physical Exam Vitals and nursing note reviewed. Exam conducted with a chaperone present.  Constitutional:      Appearance: Normal appearance. He is ill-appearing.     Comments: Sweating, appears uncomfortable but not in acute distress  HENT:     Head: Normocephalic.     Nose: Congestion present.     Mouth/Throat:     Pharynx: Posterior oropharyngeal erythema present.  Eyes:     Extraocular Movements: Extraocular movements intact.     Pupils: Pupils are equal, round, and reactive to light.  Cardiovascular:     Rate and Rhythm: Regular rhythm. Tachycardia present.  Pulmonary:     Effort: Pulmonary effort is normal.     Breath sounds: Normal breath sounds.     Comments: Lungs CTA bilaterally. No accessory muscle use. Speaking in complete sentences.  Abdominal:  General: Abdomen is flat.     Palpations: Abdomen is soft.  Musculoskeletal:     Cervical back: Normal range of motion.  Neurological:     Mental Status: He is alert.  Psychiatric:        Mood and Affect: Mood normal.   ED Results / Procedures / Treatments   Labs (all labs ordered are listed, but only abnormal results are displayed) Labs Reviewed  RESP PANEL BY RT-PCR (FLU A&B, COVID) ARPGX2  GROUP A STREP BY PCR  URINALYSIS, ROUTINE W REFLEX MICROSCOPIC  GC/CHLAMYDIA PROBE AMP ([place]) NOT AT [facility]    EKG None  Radiology No results found.  Procedures Procedures   Medications Ordered in ED Medications  ibuprofen  (ADVIL) tablet 800 mg (has no administration in time range)  acetaminophen (TYLENOL) tablet 1,000 mg (has no administration in time range)    ED Course  I have reviewed the triage vital signs and the nursing notes.  Pertinent labs & imaging results that were available during my care of the patient were reviewed by me and considered in my medical decision making (see chart for details).  Clinical Course as of 07/26/21 1741  Tue Jul 26, 2021  1657 DG Chest Portable 1 View No evidence of pneumonia or underlying pulmonary disease as the etiology of his persistent cough [HS]  1658 Urinalysis, Routine w reflex microscopic Urine, Clean Catch(!) No UTI, some proteinuria consistent with dehydration. [HS]    Clinical Course User Index [HS] Theron Arista, PA-C   MDM Rules/Calculators/A&P                           Patient was initially febrile tachycardic, viral panel ordered as well as strep.  Patient given Tylenol Motrin.  Symptoms improved, vitals improved.  Patient is flu positive, offered supportive care.  Patient discharged in stable condition.  Final Clinical Impression(s) / ED Diagnoses Final diagnoses:  None    Rx / DC Orders ED Discharge Orders     None        Theron Arista, PA-C 07/26/21 1742    Virgina Norfolk, DO 07/26/21 1745

## 2021-07-26 NOTE — Discharge Instructions (Addendum)
You don't have an urinary tract infection. Please check my chart for results of the gonorrhea/chlamydia test. Chest xray negative for pneumonia.   Suspect your symptoms are due to a viral illness.   Take tylenol and motin as needed for fever and bodyaches.  Take the Tessalon Perles every 8 hours as needed for coughing.

## 2021-07-27 LAB — GC/CHLAMYDIA PROBE AMP (~~LOC~~) NOT AT ARMC
Chlamydia: NEGATIVE
Comment: NEGATIVE
Comment: NORMAL
Neisseria Gonorrhea: NEGATIVE

## 2021-07-28 ENCOUNTER — Other Ambulatory Visit: Payer: Self-pay

## 2021-07-28 ENCOUNTER — Encounter (HOSPITAL_BASED_OUTPATIENT_CLINIC_OR_DEPARTMENT_OTHER): Payer: Self-pay | Admitting: *Deleted

## 2021-07-28 ENCOUNTER — Emergency Department (HOSPITAL_BASED_OUTPATIENT_CLINIC_OR_DEPARTMENT_OTHER)
Admission: EM | Admit: 2021-07-28 | Discharge: 2021-07-28 | Disposition: A | Discharge status: Home / Self Care | Payer: Self-pay | Attending: Emergency Medicine | Admitting: Emergency Medicine

## 2021-07-28 DIAGNOSIS — J101 Influenza due to other identified influenza virus with other respiratory manifestations: Secondary | ICD-10-CM | POA: Insufficient documentation

## 2021-07-28 DIAGNOSIS — F17210 Nicotine dependence, cigarettes, uncomplicated: Secondary | ICD-10-CM | POA: Insufficient documentation

## 2021-07-28 MED ORDER — DICYCLOMINE HCL 20 MG PO TABS: 20.0000 mg | ORAL_TABLET | Freq: Two times a day (BID) | ORAL | 0 refills | Status: AC | PRN

## 2021-07-28 MED ORDER — LOPERAMIDE HCL 2 MG PO CAPS: 2.0000 mg | ORAL_CAPSULE | Freq: Four times a day (QID) | ORAL | 0 refills | Status: AC | PRN

## 2021-07-28 NOTE — ED Provider Notes (Signed)
[facility] EMERGENCY DEPT Provider Note   CSN: [id number] Arrival date & time: 07/28/21  1210     History Chief Complaint  Patient presents with   Flu symptoms    Jon Villegas is a 30 y.o. male.  Patient with no significant past medical history presents the emergency department for flulike symptoms.  Patient was seen 2 days ago and had a positive test for the flu.  Patient has had symptoms for about 9 days.  He represents today due to ongoing body aches, diarrhea.  He continues to have cough and congestion.  Reports occasional fevers.  No dysuria, increased frequency or urgency.  UA and STI testing performed 2 days ago were negative.  He had a chest x-ray at that time which did not demonstrate pneumonia.  Patient's main concern is that he was not feeling better yet.  Otherwise no abdominal pain, vomiting.  He reports 10 episodes of watery stool per day without blood.  No recent antibiotic use or travel.      History reviewed. No pertinent past medical history.  There are no problems to display for this patient.   History reviewed. No pertinent surgical history.     Family History  Problem Relation Age of Onset   Diabetes Mother    Hypertension Mother    Asthma Mother     Social History   Tobacco Use   Smoking status: Every Day    Packs/day: 0.20    Types: Cigarettes   Smokeless tobacco: Never  Vaping Use   Vaping Use: Never used  Substance Use Topics   Alcohol use: Never   Drug use: Never    Home Medications Prior to Admission medications   Medication Sig Start Date End Date Taking? Authorizing Provider  benzonatate (TESSALON) 100 MG capsule Take 1 capsule (100 mg total) by mouth every 8 (eight) hours. 07/26/21  Yes Theron Arista, PA-C  dicyclomine (BENTYL) 20 MG tablet Take 1 tablet (20 mg total) by mouth 2 (two) times daily as needed (abdominal cramps). 07/28/21  Yes Renne Crigler, PA-C  loperamide (IMODIUM) 2 MG capsule Take 1 capsule (2  mg total) by mouth 4 (four) times daily as needed for diarrhea or loose stools. 07/28/21  Yes Renne Crigler, PA-C    Allergies    Patient has no known allergies.  Review of Systems   Review of Systems  Constitutional:  Positive for fever. Negative for chills and fatigue.  HENT:  Positive for congestion. Negative for ear pain, rhinorrhea, sinus pressure and sore throat.   Eyes:  Negative for redness.  Respiratory:  Positive for cough and chest tightness. Negative for wheezing.   Gastrointestinal:  Positive for abdominal pain (Intermittent cramping) and diarrhea. Negative for nausea and vomiting.  Genitourinary:  Negative for dysuria.  Musculoskeletal:  Positive for myalgias (Bilateral pelvic pain). Negative for neck stiffness.  Skin:  Negative for rash.  Neurological:  Negative for headaches.  Hematological:  Negative for adenopathy.   Physical Exam Updated Vital Signs BP (!) 134/103 (BP Location: Right Arm)   Pulse 98   Temp 98.3 F (36.8 C) (Oral)   Resp 16   Ht 5\' 9"  (1.753 m)   Wt 104.3 kg   SpO2 100%   BMI 33.97 kg/m   Physical Exam Vitals and nursing note reviewed.  Constitutional:      Appearance: He is well-developed.  HENT:     Head: Normocephalic and atraumatic.     Jaw: No trismus.  Right Ear: Tympanic membrane, ear canal and external ear normal.     Left Ear: Tympanic membrane, ear canal and external ear normal.     Nose: Nose normal. No mucosal edema or rhinorrhea.     Mouth/Throat:     Mouth: Mucous membranes are not dry.     Pharynx: Uvula midline. No oropharyngeal exudate, posterior oropharyngeal erythema or uvula swelling.     Tonsils: No tonsillar abscesses.  Eyes:     General:        Right eye: No discharge.        Left eye: No discharge.     Conjunctiva/sclera: Conjunctivae normal.  Cardiovascular:     Rate and Rhythm: Normal rate and regular rhythm.     Heart sounds: Normal heart sounds.  Pulmonary:     Effort: Pulmonary effort is normal.  No respiratory distress.     Breath sounds: Normal breath sounds. No wheezing or rales.     Comments: Lungs are clear to auscultation bilaterally. Abdominal:     Palpations: Abdomen is soft.     Tenderness: There is no abdominal tenderness. There is no guarding or rebound.     Comments: No tenderness to palpation of the upper or lower abdomen.  Musculoskeletal:     Cervical back: Normal range of motion and neck supple.  Skin:    General: Skin is warm and dry.  Neurological:     Mental Status: He is alert.    ED Results / Procedures / Treatments   Labs (all labs ordered are listed, but only abnormal results are displayed) Labs Reviewed - No data to display  EKG None  Radiology DG Chest Portable 1 View  Result Date: 07/26/2021 CLINICAL DATA:  Cough EXAM: PORTABLE CHEST 1 VIEW COMPARISON:  Chest x-ray 07/23/2011 FINDINGS: The heart and mediastinal contours are within normal limits. No focal consolidation. No pulmonary edema. No pleural effusion. No pneumothorax. No acute osseous abnormality. IMPRESSION: No active disease. Electronically Signed   By: Tish Frederickson M.D.   On: 07/26/2021 16:38    Procedures Procedures   Medications Ordered in ED Medications - No data to display  ED Course  I have reviewed the triage vital signs and the nursing notes.  Pertinent labs & imaging results that were available during my care of the patient were reviewed by me and considered in my medical decision making (see chart for details).  Patient seen and examined.  Reviewed work-up from previous visit.  Vital signs reviewed and are as follows: BP (!) 134/103 (BP Location: Right Arm)   Pulse 98   Temp 98.3 F (36.8 C) (Oral)   Resp 16   Ht 5\' 9"  (1.753 m)   Wt 104.3 kg   SpO2 100%   BMI 33.97 kg/m   Discussed with patient that we could offer additional symptom control, focusing on GI symptoms and abdominal cramps.  This includes Bentyl and Imodium.  I did discuss checking labs with  patient for further reassurance, given ongoing diarrhea.  Did discuss that typically flulike illness would be improving by this point.  He states that he does not like needles and would like to avoid blood work if possible.  Patient states that he is willing to return if worsening or follow-up with his doctor if symptoms are still not getting much better.  Will defer lab testing at this time.  Patient discharged to home. Encouraged to rest and drink plenty of fluids.  Patient told to return to ED or  see their primary doctor if their symptoms worsen, high fever not controlled with tylenol, persistent vomiting, they feel they are dehydrated, or if they have any other concerns.  Patient verbalized understanding and agreed with plan.       MDM Rules/Calculators/A&P                           Patient with symptoms consistent with influenza.  He looks well.  Reassuring x-ray less than 48 hours ago.  Most bothersome symptoms at this point appears to be GI in nature with diarrhea and abdominal cramping.  Abdominal exam is reassuring without significant tenderness.  Vitals are stable, afebrile here. No signs of dehydration, tolerating PO's. Lungs are clear. Supportive therapy indicated with return if symptoms worsen. Patient counseled.  Final Clinical Impression(s) / ED Diagnoses Final diagnoses:  Influenza A    Rx / DC Orders ED Discharge Orders          Ordered    loperamide (IMODIUM) 2 MG capsule  4 times daily PRN        07/28/21 1335    dicyclomine (BENTYL) 20 MG tablet  2 times daily PRN        07/28/21 1335             Renne Crigler, PA-C 07/28/21 1359    Derwood Kaplan, MD 07/29/21 1007

## 2021-07-28 NOTE — Discharge Instructions (Addendum)
Please read and follow all provided instructions.  Your diagnoses today include:  1. Influenza A     Tests performed today include: Vital signs. See below for your results today.   Medications prescribed:  Bentyl - medication for intestinal cramps and spasms  Take any prescribed medications only as directed.  Home care instructions:  Follow any educational materials contained in this packet. Please continue drinking plenty of fluids. Use over-the-counter cold and flu medications as needed as directed on packaging for symptom relief. You may also use ibuprofen or tylenol as directed on packaging for pain or fever.   BE VERY CAREFUL not to take multiple medicines containing Tylenol (also called acetaminophen). Doing so can lead to an overdose which can damage your liver and cause liver failure and possibly death.   Follow-up instructions: Please follow-up with your primary care provider in the next 3 days for further evaluation of your symptoms.   Return instructions:  Please return to the Emergency Department if you experience worsening symptoms. Please return if you have a high fever greater than 101 degrees not controlled with over-the-counter medications, persistent vomiting and cannot keep down fluids, or worsening trouble breathing. Please return if you have any other emergent concerns.  Additional Information:  Your vital signs today were: BP (!) 134/103 (BP Location: Right Arm)   Pulse 98   Temp 98.3 F (36.8 C) (Oral)   Resp 16   Ht 5\' 9"  (1.753 m)   Wt 104.3 kg   SpO2 100%   BMI 33.97 kg/m  If your blood pressure (BP) was elevated above 135/85 this visit, please have this repeated by your doctor within one month.

## 2021-07-28 NOTE — ED Triage Notes (Signed)
Patient was here 2 days ago and came in with the same complaint.

## 2022-03-04 IMAGING — DX DG CHEST 1V PORT
1 series · 1 of 1 positions shown · non-contrast
Comparison: Chest x-ray 07/23/2011

CLINICAL DATA: Cough

EXAM:
PORTABLE CHEST 1 VIEW

[chest]
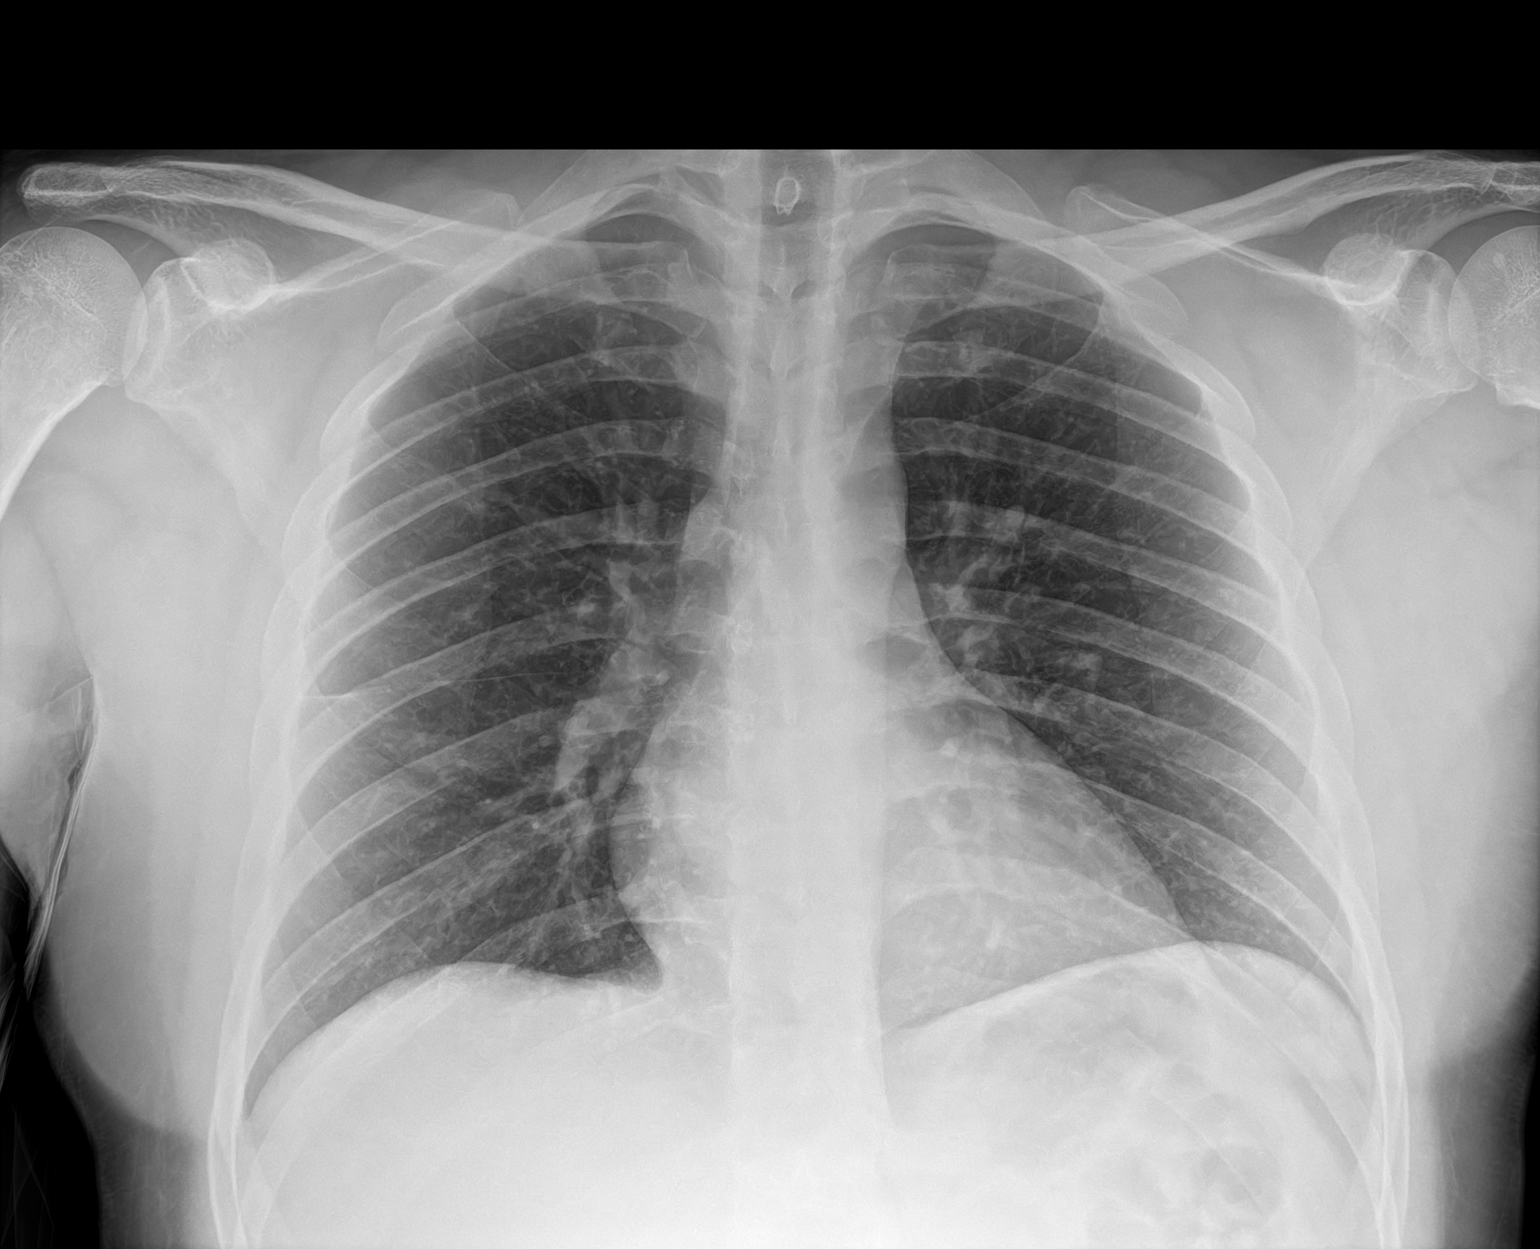

[1 of 1 positions shown; findings below may reference images not displayed]

FINDINGS: The heart and mediastinal contours are within normal limits.

No focal consolidation. No pulmonary edema. No pleural effusion. No
pneumothorax.

No acute osseous abnormality.
IMPRESSION: No active disease.
